# Patient Record
Sex: Male | Born: 1995 | Race: Black or African American | Hispanic: No | Marital: Single | Smoking: Never smoker
Health system: Southern US, Community
[De-identification: ages and names within clinical notes are randomized; demographics above are authoritative.]

---
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
## 2019-02-27 ENCOUNTER — Other Ambulatory Visit: Payer: Self-pay

## 2019-02-27 ENCOUNTER — Encounter (HOSPITAL_COMMUNITY): Payer: Self-pay | Admitting: Emergency Medicine

## 2019-02-27 ENCOUNTER — Emergency Department (HOSPITAL_COMMUNITY)
Admission: EM | Admit: 2019-02-27 | Discharge: 2019-02-27 | Disposition: A | Discharge status: Home / Self Care | Payer: Self-pay | Attending: Emergency Medicine | Admitting: Emergency Medicine

## 2019-02-27 DIAGNOSIS — W540XXA Bitten by dog, initial encounter: Secondary | ICD-10-CM | POA: Insufficient documentation

## 2019-02-27 DIAGNOSIS — S01511A Laceration without foreign body of lip, initial encounter: Secondary | ICD-10-CM | POA: Insufficient documentation

## 2019-02-27 DIAGNOSIS — Z23 Encounter for immunization: Secondary | ICD-10-CM | POA: Insufficient documentation

## 2019-02-27 DIAGNOSIS — Y999 Unspecified external cause status: Secondary | ICD-10-CM | POA: Insufficient documentation

## 2019-02-27 DIAGNOSIS — Y929 Unspecified place or not applicable: Secondary | ICD-10-CM | POA: Insufficient documentation

## 2019-02-27 DIAGNOSIS — Y939 Activity, unspecified: Secondary | ICD-10-CM | POA: Insufficient documentation

## 2019-02-27 MED ORDER — AMOXICILLIN-POT CLAVULANATE 875-125 MG PO TABS: 1.0000 | ORAL_TABLET | Freq: Two times a day (BID) | ORAL | 0 refills | Status: AC

## 2019-02-27 MED ORDER — LIDOCAINE-EPINEPHRINE (PF) 2 %-1:200000 IJ SOLN
20.0000 mL | Freq: Once | INTRAMUSCULAR | Status: AC
  Administered 2019-02-27: 20 mL
  Filled 2019-02-27: qty 20

## 2019-02-27 MED ORDER — AMOXICILLIN-POT CLAVULANATE 875-125 MG PO TABS
1.0000 | ORAL_TABLET | Freq: Once | ORAL | Status: AC
  Administered 2019-02-27: 1 via ORAL
  Filled 2019-02-27: qty 1

## 2019-02-27 MED ORDER — HYDROCODONE-ACETAMINOPHEN 5-325 MG PO TABS
2.0000 | ORAL_TABLET | Freq: Once | ORAL | Status: AC
  Administered 2019-02-27: 2 via ORAL
  Filled 2019-02-27: qty 2

## 2019-02-27 MED ORDER — TETANUS-DIPHTH-ACELL PERTUSSIS 5-2.5-18.5 LF-MCG/0.5 IM SUSP
0.5000 mL | Freq: Once | INTRAMUSCULAR | Status: AC
Start: 2019-02-27 — End: 2019-02-27
  Administered 2019-02-27: 0.5 mL via INTRAMUSCULAR
  Filled 2019-02-27: qty 0.5

## 2019-02-27 NOTE — ED Provider Notes (Addendum)
[facility] EMERGENCY DEPARTMENT Provider Note   CSN: [id number] Arrival date & time: 02/27/19  0201    History   Chief Complaint Chief Complaint  Patient presents with  . Animal Bite    HPI Cody Barrett is a 23 y.o. male.     The history is provided by the patient. No language interpreter was used.    History reviewed. No pertinent past medical history.  There are no active problems to display for this patient.   History reviewed. No pertinent surgical history.      Home Medications    Prior to Admission medications   Not on File    Family History No family history on file.  Social History Social History   Tobacco Use  . Smoking status: Never Smoker  . Smokeless tobacco: Never Used  Substance Use Topics  . Alcohol use: Yes  . Drug use: Not Currently     Allergies   Patient has no known allergies.   Review of Systems Review of Systems  All other systems reviewed and are negative.    Physical Exam Updated Vital Signs BP 132/81 (BP Location: Right Arm)   Pulse 89   Temp 98 F (36.7 C) (Oral)   Resp 16   SpO2 98%   Physical Exam Vitals signs and nursing note reviewed.  Constitutional:      General: He is not in acute distress.    Appearance: He is not diaphoretic.  HENT:     Head: Normocephalic and atraumatic.     Mouth/Throat:     Comments: Facial lacerations as pictured Eyes:     Conjunctiva/sclera: Conjunctivae normal.     Pupils: Pupils are equal, round, and reactive to light.  Neck:     Trachea: No tracheal deviation.  Cardiovascular:     Rate and Rhythm: Normal rate.  Pulmonary:     Effort: Pulmonary effort is normal. No respiratory distress.  Abdominal:     Palpations: Abdomen is soft.  Musculoskeletal: Normal range of motion.  Skin:    General: Skin is warm and dry.  Neurological:     Mental Status: He is alert and oriented to person, place, and time.  Psychiatric:        Judgment: Judgment  normal.        ED Treatments / Results  Labs (all labs ordered are listed, but only abnormal results are displayed) Labs Reviewed - No data to display  EKG None  Radiology No results found.  Procedures Procedures (including critical care time) LACERATION REPAIR Performed by: Roxy Horseman Authorized by: Roxy Horseman Consent: Verbal consent obtained. Risks and benefits: risks, benefits and alternatives were discussed Consent given by: patient Patient identity confirmed: provided demographic data Prepped and Draped in normal sterile fashion Wound explored  Laceration Location: lip  Laceration Length:2 cm  No Foreign Bodies seen or palpated  Anesthesia: local infiltration  Local anesthetic: lidocaine 1% with epinephrine  Anesthetic total: 1 ml  Irrigation method: syringe Amount of cleaning: standard  Skin closure: 5-0 vicryl plus  Number of sutures: 4  Technique: interrupted  Patient tolerance: Patient tolerated the procedure well with no immediate complications.  LACERATION REPAIR Performed by: Roxy Horseman Authorized by: Roxy Horseman Consent: Verbal consent obtained. Risks and benefits: risks, benefits and alternatives were discussed Consent given by: patient Patient identity confirmed: provided demographic data Prepped and Draped in normal sterile fashion Wound explored  Laceration Location: lip  Laceration Length: 3 cm  No Foreign Bodies seen  or palpated  Anesthesia: local infiltration  Local anesthetic: lidocaine 1% with epinephrine  Anesthetic total: 4 ml  Irrigation method: syringe Amount of cleaning: standard  Skin closure: 5-0 vicryl plus  Number of sutures: 13  Technique: interrupted  Patient tolerance: Patient tolerated the procedure well with no immediate complications.   Medications Ordered in ED Medications - No data to display   Initial Impression / Assessment and Plan / ED Course  I have reviewed the  triage vital signs and the nursing notes.  Pertinent labs & imaging results that were available during my care of the patient were reviewed by me and considered in my medical decision making (see chart for details).        Patient with complex lip lacerations after dog bite tonight.  The dog is current on its rabies shots.  Patient given tetanus prophylaxis.  Wounds were irrigated and repaired in the emergency department by me under direction of Dr. Clayborne Dana.  Dr. Clayborne Dana and myself both explored the wounds.  Patient advised that he may need to follow-up with plastic surgery after the swelling goes down.  Patient understands and agrees with the plan.   Final Clinical Impressions(s) / ED Diagnoses   Final diagnoses:  Dog bite, initial encounter    ED Discharge Orders         Ordered    amoxicillin-clavulanate (AUGMENTIN) 875-125 MG tablet  Every 12 hours     02/27/19 0449           Roxy Horseman, PA-C 02/27/19 0455    Mesner, Barbara Cower, MD 02/27/19 0641    Roxy Horseman, PA-C 03/07/19 2241    Mesner, Barbara Cower, MD 03/09/19 2111

## 2019-02-27 NOTE — ED Notes (Signed)
Patient verbalizes understanding of medications and discharge instructions. No further questions at this time. VSS and patient ambulatory at discharge.   

## 2019-02-27 NOTE — ED Triage Notes (Signed)
Pt BIB [facility] from home, bitten by a dog he knows. Lacerations to the left side of his mouth. Bleeding controlled at this time. Unknown last tetanus, dog's rabies shots up to date.

## 2020-11-22 ENCOUNTER — Ambulatory Visit (HOSPITAL_COMMUNITY)
Admission: EM | Admit: 2020-11-22 | Discharge: 2020-11-22 | Disposition: A | Discharge status: Home / Self Care | Payer: Medicaid Other

## 2020-11-22 ENCOUNTER — Other Ambulatory Visit: Payer: Self-pay

## 2020-11-28 ENCOUNTER — Ambulatory Visit (HOSPITAL_COMMUNITY)
Admission: EM | Admit: 2020-11-28 | Discharge: 2020-11-28 | Disposition: A | Discharge status: Home / Self Care | Payer: BC Managed Care – PPO | Attending: Family Medicine | Admitting: Family Medicine

## 2020-11-28 ENCOUNTER — Other Ambulatory Visit: Payer: Self-pay

## 2020-11-28 ENCOUNTER — Encounter (HOSPITAL_COMMUNITY): Payer: Self-pay | Admitting: *Deleted

## 2020-11-28 DIAGNOSIS — M25512 Pain in left shoulder: Secondary | ICD-10-CM | POA: Diagnosis not present

## 2020-11-28 MED ORDER — PREDNISONE 20 MG PO TABS: 40.0000 mg | ORAL_TABLET | Freq: Every day | ORAL | 0 refills | Status: AC

## 2020-11-28 NOTE — ED Triage Notes (Signed)
Pt reports Lt shoulder pain for one month. Pt reports lifting heavy objects makes pain worse.

## 2020-12-01 NOTE — ED Provider Notes (Signed)
  [facility]   [id number] 11/28/20 Arrival Time: 1344  ASSESSMENT & PLAN:  1. Acute pain of left shoulder     No indication for plain imaging of shoulder at this time. Encourage shoulder ROM. Work note provided; light duty for one week.  Begin trial of: Meds ordered this encounter  Medications  . predniSONE (DELTASONE) 20 MG tablet    Sig: Take 2 tablets (40 mg total) by mouth daily.    Dispense:  10 tablet    Refill:  0    Recommend:  Follow-up Information    [facility].   Why: If worsening or failing to improve as anticipated. Contact information: [address] [place] [address] [phone]              Reviewed expectations re: course of current medical issues. Questions answered. Outlined signs and symptoms indicating need for more acute intervention. Patient verbalized understanding. After Visit Summary given.  SUBJECTIVE: History from: patient. Cody Barrett is a 24 y.o. male who reports intermittent mild to moderate pain of his left shoulder; described as aching; without radiation. Onset: gradual. First noted: about a month ago. Injury/trama: no but first noted pain at work when pulling on a hose fitting. Symptoms have waxed and waned but are worse overall since beginning. Aggravating factors: certain movements. Alleviating factors: have not been identified. Associated symptoms: none reported. Extremity sensation changes or weakness: none. Self treatment: has not tried OTC therapies.  History of similar: no.  History reviewed. No pertinent surgical history.    OBJECTIVE:  Vitals:   11/28/20 1534 11/28/20 1535  BP:  126/70  Pulse:  65  Resp:  18  Temp:  98.5 F (36.9 C)  TempSrc:  Oral  SpO2:  98%  Weight: 111.1 kg   Height: 5\' 9"  (1.753 m)     General appearance: alert; no distress HEENT: [place]; AT Neck: supple with FROM Resp: unlabored respirations Extremities: . LUE:  warm with well perfused appearance; poorly localized moderate tenderness over right anterior shoulder; without gross deformities; swelling: none; bruising: none; shoulder ROM: normal, with discomfort CV: brisk extremity capillary refill of LUE; 2+ radial pulse of LUE. Skin: warm and dry; no visible rashes Neurologic: gait normal; normal sensation and strength of LUE Psychological: alert and cooperative; normal mood and affect   No Known Allergies  History reviewed. No pertinent past medical history. Social History   Socioeconomic History  . Marital status: Single    Spouse name: Not on file  . Number of children: Not on file  . Years of education: Not on file  . Highest education level: Not on file  Occupational History  . Not on file  Tobacco Use  . Smoking status: Never Smoker  . Smokeless tobacco: Never Used  Substance and Sexual Activity  . Alcohol use: Yes  . Drug use: Not Currently  . Sexual activity: Not on file  Other Topics Concern  . Not on file  Social History Narrative  . Not on file   Social Determinants of Health   Financial Resource Strain: Not on file  Food Insecurity: Not on file  Transportation Needs: Not on file  Physical Activity: Not on file  Stress: Not on file  Social Connections: Not on file   History reviewed. No pertinent family history. History reviewed. No pertinent surgical history.    , MD 12/01/20 [phone]
